# Patient Record
Sex: Female | Born: 1935 | Race: White | Hispanic: No | Smoking: Former smoker
Health system: Southern US, Community
[De-identification: ages and names within clinical notes are randomized; demographics above are authoritative.]

## PROBLEM LIST (undated history)

## (undated) DIAGNOSIS — E119 Type 2 diabetes mellitus without complications: Secondary | ICD-10-CM

## (undated) DIAGNOSIS — I10 Essential (primary) hypertension: Secondary | ICD-10-CM

## (undated) DIAGNOSIS — K219 Gastro-esophageal reflux disease without esophagitis: Secondary | ICD-10-CM

## (undated) DIAGNOSIS — N289 Disorder of kidney and ureter, unspecified: Secondary | ICD-10-CM

## (undated) DIAGNOSIS — M1990 Unspecified osteoarthritis, unspecified site: Secondary | ICD-10-CM

## (undated) HISTORY — PX: ABDOMINAL HYSTERECTOMY: SHX81

## (undated) HISTORY — PX: TOE SURGERY: SHX1073

## (undated) HISTORY — PX: MANDIBLE SURGERY: SHX707

## (undated) HISTORY — PX: OTHER SURGICAL HISTORY: SHX169

---

## 2012-08-10 ENCOUNTER — Emergency Department (HOSPITAL_BASED_OUTPATIENT_CLINIC_OR_DEPARTMENT_OTHER)
Admission: EM | Admit: 2012-08-10 | Discharge: 2012-08-10 | Disposition: A | Discharge status: Home / Self Care | Payer: Medicare Other | Attending: Emergency Medicine | Admitting: Emergency Medicine

## 2012-08-10 ENCOUNTER — Encounter (HOSPITAL_BASED_OUTPATIENT_CLINIC_OR_DEPARTMENT_OTHER): Payer: Self-pay | Admitting: Emergency Medicine

## 2012-08-10 ENCOUNTER — Emergency Department (HOSPITAL_BASED_OUTPATIENT_CLINIC_OR_DEPARTMENT_OTHER): Payer: Medicare Other

## 2012-08-10 DIAGNOSIS — Y929 Unspecified place or not applicable: Secondary | ICD-10-CM | POA: Insufficient documentation

## 2012-08-10 DIAGNOSIS — I10 Essential (primary) hypertension: Secondary | ICD-10-CM | POA: Insufficient documentation

## 2012-08-10 DIAGNOSIS — H1130 Conjunctival hemorrhage, unspecified eye: Secondary | ICD-10-CM | POA: Insufficient documentation

## 2012-08-10 DIAGNOSIS — Z79899 Other long term (current) drug therapy: Secondary | ICD-10-CM | POA: Insufficient documentation

## 2012-08-10 DIAGNOSIS — Z87891 Personal history of nicotine dependence: Secondary | ICD-10-CM | POA: Insufficient documentation

## 2012-08-10 DIAGNOSIS — S0083XA Contusion of other part of head, initial encounter: Secondary | ICD-10-CM

## 2012-08-10 DIAGNOSIS — K219 Gastro-esophageal reflux disease without esophagitis: Secondary | ICD-10-CM | POA: Insufficient documentation

## 2012-08-10 DIAGNOSIS — S023XXA Fracture of orbital floor, initial encounter for closed fracture: Secondary | ICD-10-CM

## 2012-08-10 DIAGNOSIS — S0230XA Fracture of orbital floor, unspecified side, initial encounter for closed fracture: Secondary | ICD-10-CM | POA: Insufficient documentation

## 2012-08-10 DIAGNOSIS — E119 Type 2 diabetes mellitus without complications: Secondary | ICD-10-CM | POA: Insufficient documentation

## 2012-08-10 DIAGNOSIS — Y9301 Activity, walking, marching and hiking: Secondary | ICD-10-CM | POA: Insufficient documentation

## 2012-08-10 DIAGNOSIS — Z880 Allergy status to penicillin: Secondary | ICD-10-CM | POA: Insufficient documentation

## 2012-08-10 DIAGNOSIS — H1132 Conjunctival hemorrhage, left eye: Secondary | ICD-10-CM

## 2012-08-10 DIAGNOSIS — Z87448 Personal history of other diseases of urinary system: Secondary | ICD-10-CM | POA: Insufficient documentation

## 2012-08-10 DIAGNOSIS — S0003XA Contusion of scalp, initial encounter: Secondary | ICD-10-CM | POA: Insufficient documentation

## 2012-08-10 DIAGNOSIS — W010XXA Fall on same level from slipping, tripping and stumbling without subsequent striking against object, initial encounter: Secondary | ICD-10-CM | POA: Insufficient documentation

## 2012-08-10 DIAGNOSIS — Z8739 Personal history of other diseases of the musculoskeletal system and connective tissue: Secondary | ICD-10-CM | POA: Insufficient documentation

## 2012-08-10 HISTORY — DX: Disorder of kidney and ureter, unspecified: N28.9

## 2012-08-10 HISTORY — DX: Unspecified osteoarthritis, unspecified site: M19.90

## 2012-08-10 HISTORY — DX: Type 2 diabetes mellitus without complications: E11.9

## 2012-08-10 HISTORY — DX: Essential (primary) hypertension: I10

## 2012-08-10 HISTORY — DX: Gastro-esophageal reflux disease without esophagitis: K21.9

## 2012-08-10 MED ORDER — KETOROLAC TROMETHAMINE 0.5 % OP SOLN: 1.0000 [drp] | Freq: Four times a day (QID) | OPHTHALMIC | Status: AC

## 2012-08-10 MED ORDER — CEPHALEXIN 500 MG PO CAPS: 500.0000 mg | ORAL_CAPSULE | Freq: Four times a day (QID) | ORAL | Status: AC

## 2012-08-10 NOTE — ED Notes (Signed)
MD at bedside. 

## 2012-08-10 NOTE — ED Provider Notes (Signed)
History    CSN: [id number] Arrival date & time 08/10/12  1147  First MD Initiated Contact with Patient 08/10/12 1222     Chief Complaint  Patient presents with  . Fall   (Consider location/radiation/quality/duration/timing/severity/associated sxs/prior Treatment) HPI Comments: Patient was walking this morning and tripped over a raised area in the floor.  She fell forward and struck her left forehead on the floor.  She denies loc or headache.  She has a laceration above the left forehead and swelling around the left eye.  There is also redness to the left eye itself.    Patient is a 79 y.o. female presenting with fall. The history is provided by the patient.  Fall This is a new problem. The current episode started 1 to 2 hours ago. The problem occurs constantly. The problem has not changed since onset.Pertinent negatives include no headaches. Nothing aggravates the symptoms. Nothing relieves the symptoms. She has tried nothing for the symptoms. The treatment provided no relief.   Past Medical History  Diagnosis Date  . Diabetes mellitus without complication   . Hypertension   . Renal disorder     between stage 2 and stage 3 renal deficency.   Marland Kitchen GERD (gastroesophageal reflux disease)   . Arthritis    Past Surgical History  Procedure Laterality Date  . Carotid artery repair    . Abdominal hysterectomy    . Mandible surgery    . Toe surgery     No family history on file. History  Substance Use Topics  . Smoking status: Former Games developer  . Smokeless tobacco: Not on file  . Alcohol Use: No   OB History   Grav Para Term Preterm Abortions TAB SAB Ect Mult Living                 Review of Systems  Neurological: Negative for headaches.  All other systems reviewed and are negative.    Allergies  Penicillins and Statins  Home Medications   Current Outpatient Rx  Name  Route  Sig  Dispense  Refill  . GABAPENTIN, PHN, PO   Oral   Take by mouth at bedtime.         Marland Kitchen  losartan (COZAAR) 100 MG tablet   Oral   Take by mouth daily.         Marland Kitchen OMEPRAZOLE PO   Oral   Take by mouth daily.          BP 175/58  Pulse 76  Temp(Src) 98.6 F (37 C) (Oral)  Resp 18  Ht 5\' 1"  (1.549 m)  Wt 122 lb (55.339 kg)  BMI 23.06 kg/m2  SpO2 98% Physical Exam  Nursing note and vitals reviewed. Constitutional: She is oriented to person, place, and time. She appears well-developed and well-nourished. No distress.  HENT:  Head: Normocephalic.  Right Ear: External ear normal.  Left Ear: External ear normal.  Mouth/Throat: Oropharynx is clear and moist.  TM's are clear and equal bilaterally.  Eyes: EOM are normal. Pupils are equal, round, and reactive to light.  There is a left subconjunctival hemorrhage noted.  There is no evidence of globe rupture.  The pupils are equally round and reactive.    Neck: Normal range of motion. Neck supple.  Cardiovascular: Normal rate and regular rhythm.   No murmur heard. Pulmonary/Chest: Effort normal and breath sounds normal. No respiratory distress. She has no wheezes.  Abdominal: Soft. Bowel sounds are normal.  Musculoskeletal: Normal range of motion. She  exhibits no edema.  Neurological: She is alert and oriented to person, place, and time. No cranial nerve deficit. She exhibits normal muscle tone. Coordination normal.  Skin: Skin is warm and dry. She is not diaphoretic.    ED Course  Procedures (including critical care time) Labs Reviewed - No data to display No results found. No diagnosis found.  LACERATION REPAIR Performed by: Geoffery Lyons Authorized by: Geoffery Lyons Consent: Verbal consent obtained. Risks and benefits: risks, benefits and alternatives were discussed Consent given by: patient Patient identity confirmed: provided demographic data Prepped and Draped in normal sterile fashion Wound explored  Laceration Location: left eyebrow  Laceration Length: 2.5cm  No Foreign Bodies seen or  palpated  Anesthesia: local infiltration  Local anesthetic: lidocaine 2% without epinephrine  Anesthetic total: 2 ml  Irrigation method: syringe Amount of cleaning: standard  Skin closure: 6-0 prolene  Number of sutures: 3  Technique: simple interrupted  Patient tolerance: Patient tolerated the procedure well with no immediate complications.   MDM  The patient presents after a fall.  The workup reveals an inferior orbital blow out fracture without evidence for entrapment on exam or ct scan.  She also has decreased visual acuity and a subconjunctival hemorrhage.  I have spoken with Dr. Lazarus Salines from ENT who recommends antibiotics, ice, and follow up with him in one week.  I have also spoken with Dr. Karleen Hampshire from [facility] who will follow up the eye issues tomorrow.    Geoffery Lyons, MD 08/10/12 [phone]

## 2012-08-10 NOTE — ED Notes (Signed)
Pt reports tripped & fell today in home, hitting raised area of floor. Small abrasion to right knee, approx 1.5cm laceration above left eyebrow, and bruising to left eye. No LOC Not on blood thinners.

## 2015-10-31 ENCOUNTER — Ambulatory Visit (INDEPENDENT_AMBULATORY_CARE_PROVIDER_SITE_OTHER): Payer: Medicare Other | Admitting: Podiatry

## 2015-10-31 DIAGNOSIS — L600 Ingrowing nail: Secondary | ICD-10-CM | POA: Diagnosis not present

## 2015-10-31 DIAGNOSIS — B351 Tinea unguium: Secondary | ICD-10-CM

## 2015-10-31 DIAGNOSIS — M79673 Pain in unspecified foot: Secondary | ICD-10-CM | POA: Diagnosis not present

## 2015-10-31 NOTE — Progress Notes (Signed)
SUBJECTIVE: 82 y.o. year old female presents complaining of pain in left great toe and 5th toe right.  Last blood sugar was 160 a couple of weeks ago.   Coronary, carotid stent, IBS, Arthritis on hands feet and neck,  REVIEW OF SYSTEMS: Positive for coronary artery disease with stent, IBS, arthritis in hands, feet, and neck.  OBJECTIVE: DERMATOLOGIC EXAMINATION: Thick dystrophic nails x 10. Ingrown nail left great toe. Painful digital corn 5th right.  VASCULAR EXAMINATION OF LOWER LIMBS: All pedal pulses are faintly palpable. Temperature gradient from tibial crest to dorsum of foot is within normal bilateral.  NEUROLOGIC EXAMINATION OF THE LOWER LIMBS: Achilles DTR is present and within normal. Monofilament (Semmes-Weinstein 10-gm) sensory testing positive 6 out of 6, bilateral. Vibratory sensations(128Hz  turning fork) intact at medial and lateral forefoot bilateral.  Sharp and Dull discriminatory sensations at the plantar ball of hallux is intact bilateral.   MUSCULOSKELETAL EXAMINATION: Conracted 5th toe right.  ASSESSMENT: Onychomycosis x 10. Painful ingrown nail right great toe. Contracted 5th digit with digital corn left foot painful.  PLAN: Reviewed clinical findings and available treatment options. All affected nails and corns debrided.

## 2015-10-31 NOTE — Patient Instructions (Signed)
Seen for painful nail on left great toe and corn on 5th toe on right.  All affected lesions debrided. May benefit from diabetic shoes. Return in 3 months or sooner if needed.

## 2015-11-06 ENCOUNTER — Encounter: Payer: Self-pay | Admitting: Podiatry

## 2019-02-18 ENCOUNTER — Ambulatory Visit: Payer: Self-pay

## 2020-11-09 ENCOUNTER — Emergency Department (HOSPITAL_COMMUNITY): Payer: Medicare Other

## 2020-11-09 ENCOUNTER — Emergency Department (HOSPITAL_COMMUNITY)
Admission: EM | Admit: 2020-11-09 | Discharge: 2020-11-09 | Disposition: A | Discharge status: Home / Self Care | Payer: Medicare Other | Attending: Emergency Medicine | Admitting: Emergency Medicine

## 2020-11-09 DIAGNOSIS — N183 Chronic kidney disease, stage 3 unspecified: Secondary | ICD-10-CM | POA: Diagnosis not present

## 2020-11-09 DIAGNOSIS — R531 Weakness: Secondary | ICD-10-CM | POA: Insufficient documentation

## 2020-11-09 DIAGNOSIS — Z79899 Other long term (current) drug therapy: Secondary | ICD-10-CM | POA: Insufficient documentation

## 2020-11-09 DIAGNOSIS — I129 Hypertensive chronic kidney disease with stage 1 through stage 4 chronic kidney disease, or unspecified chronic kidney disease: Secondary | ICD-10-CM | POA: Diagnosis not present

## 2020-11-09 DIAGNOSIS — X58XXXA Exposure to other specified factors, initial encounter: Secondary | ICD-10-CM | POA: Insufficient documentation

## 2020-11-09 DIAGNOSIS — S8012XA Contusion of left lower leg, initial encounter: Secondary | ICD-10-CM | POA: Insufficient documentation

## 2020-11-09 DIAGNOSIS — S40022A Contusion of left upper arm, initial encounter: Secondary | ICD-10-CM | POA: Diagnosis not present

## 2020-11-09 DIAGNOSIS — E1122 Type 2 diabetes mellitus with diabetic chronic kidney disease: Secondary | ICD-10-CM | POA: Insufficient documentation

## 2020-11-09 DIAGNOSIS — Z87891 Personal history of nicotine dependence: Secondary | ICD-10-CM | POA: Insufficient documentation

## 2020-11-09 DIAGNOSIS — S4992XA Unspecified injury of left shoulder and upper arm, initial encounter: Secondary | ICD-10-CM | POA: Diagnosis present

## 2020-11-09 LAB — URINALYSIS, ROUTINE W REFLEX MICROSCOPIC
Bilirubin Urine: NEGATIVE
Glucose, UA: 50 mg/dL — AB
Hgb urine dipstick: NEGATIVE
Ketones, ur: NEGATIVE mg/dL
Leukocytes,Ua: NEGATIVE
Nitrite: NEGATIVE
Protein, ur: NEGATIVE mg/dL
Specific Gravity, Urine: 1.017 (ref 1.005–1.030)
pH: 6 (ref 5.0–8.0)

## 2020-11-09 LAB — COMPREHENSIVE METABOLIC PANEL
ALT: 16 U/L (ref 0–44)
AST: 25 U/L (ref 15–41)
Albumin: 3.3 g/dL — ABNORMAL LOW (ref 3.5–5.0)
Alkaline Phosphatase: 75 U/L (ref 38–126)
Anion gap: 12 (ref 5–15)
BUN: 12 mg/dL (ref 8–23)
CO2: 22 mmol/L (ref 22–32)
Calcium: 8.8 mg/dL — ABNORMAL LOW (ref 8.9–10.3)
Chloride: 104 mmol/L (ref 98–111)
Creatinine, Ser: 0.74 mg/dL (ref 0.44–1.00)
GFR, Estimated: >60 mL/min (ref >60)
Glucose, Bld: 120 mg/dL — ABNORMAL HIGH (ref 70–99)
Potassium: 3.6 mmol/L (ref 3.5–5.1)
Sodium: 138 mmol/L (ref 135–145)
Total Bilirubin: 0.6 mg/dL (ref 0.3–1.2)
Total Protein: 6.1 g/dL — ABNORMAL LOW (ref 6.5–8.1)

## 2020-11-09 LAB — CBC WITH DIFFERENTIAL/PLATELET
Abs Immature Granulocytes: 0.02 10*3/uL (ref 0.00–0.07)
Basophils Absolute: 0 10*3/uL (ref 0.0–0.1)
Basophils Relative: 0 %
Eosinophils Absolute: 0 10*3/uL (ref 0.0–0.5)
Eosinophils Relative: 2 %
HCT: 39.6 % (ref 36.0–46.0)
Hemoglobin: 12.3 g/dL (ref 12.0–15.0)
Immature Granulocytes: 1 %
Lymphocytes Relative: 28 %
Lymphs Abs: 0.7 10*3/uL (ref 0.7–4.0)
MCH: 28.3 pg (ref 26.0–34.0)
MCHC: 31.1 g/dL (ref 30.0–36.0)
MCV: 91.2 fL (ref 80.0–100.0)
Monocytes Absolute: 0.5 10*3/uL (ref 0.1–1.0)
Monocytes Relative: 22 %
Neutro Abs: 1.2 10*3/uL — ABNORMAL LOW (ref 1.7–7.7)
Neutrophils Relative %: 47 %
Platelets: 165 10*3/uL (ref 150–400)
RBC: 4.34 MIL/uL (ref 3.87–5.11)
RDW: 14.3 % (ref 11.5–15.5)
WBC: 2.4 10*3/uL — ABNORMAL LOW (ref 4.0–10.5)
nRBC: 0 % (ref 0.0–0.2)

## 2020-11-09 NOTE — ED Triage Notes (Signed)
Pt BIB [facility] from home c/o stroke like symptoms. Per ems , pt usually ambulates around w a walker;but not able to do it today.  Pt had a previous stroke that affected her L side, but  Lside  feels weaker today. Also c/o Numbness of her L leg. LSN 830 am. A&O X4   VS EMS:   Bp 160/84 P88 Rr22 Cbg 186

## 2020-11-09 NOTE — Discharge Instructions (Addendum)
Your work-up was reassuring today.  You do appear somewhat malnourished overall.  It appears your strength has returned some now.  Follow-up with your doctors.

## 2020-11-09 NOTE — ED Provider Notes (Signed)
[facility] EMERGENCY DEPARTMENT Provider Note   CSN: [id number] Arrival date & time: 11/09/20  1619     History No chief complaint on file.   Cynthia Odom is a 87 y.o. female.  HPI Was presents with generalized weakness.  Reported brought in for strokelike symptoms.  Reportedly was weak this morning in both her legs and her arms also.  Has had a previous stroke with some chronic left-sided weakness.  Walks with a walker at baseline.  Was unable to walk today because both legs were feeling weak.  Was weaker on the left compared to the right but states she is always weaker.  Had been seen at PCP yesterday after a fall about a week ago.  Had x-rays done of the left arm and leg but unsure results.  Has had decreased oral intake.  Reportedly had difficulty lifting up her tea today.  No headache.  No dysuria.  No back pain.    Past Medical History:  Diagnosis Date   Arthritis    Diabetes mellitus without complication (HCC)    GERD (gastroesophageal reflux disease)    Hypertension    Renal disorder    between stage 2 and stage 3 renal deficency.     There are no problems to display for this patient.   Past Surgical History:  Procedure Laterality Date   ABDOMINAL HYSTERECTOMY     carotid artery repair     MANDIBLE SURGERY     TOE SURGERY       OB History   No obstetric history on file.     No family history on file.  Social History   Tobacco Use   Smoking status: Former  Substance Use Topics   Alcohol use: No   Drug use: No    Home Medications Prior to Admission medications   Medication Sig Start Date End Date Taking? Authorizing Provider  cephALEXin (KEFLEX) 500 MG capsule Take 1 capsule (500 mg total) by mouth 4 (four) times daily. 08/10/12   Geoffery Lyons, MD  GABAPENTIN, PHN, PO Take by mouth at bedtime.    [provider]  ketorolac (ACULAR) 0.5 % ophthalmic solution Place 1 drop into the left eye 4 (four) times daily. 08/10/12    Geoffery Lyons, MD  losartan (COZAAR) 100 MG tablet Take by mouth daily.    [provider]  OMEPRAZOLE PO Take by mouth daily.    [provider]    Allergies    Penicillins and Statins  Review of Systems   Review of Systems  Constitutional:  Positive for fatigue. Negative for appetite change and fever.  HENT:  Negative for congestion.   Respiratory:  Negative for shortness of breath.   Cardiovascular:  Negative for chest pain.  Gastrointestinal:  Negative for abdominal pain.  Genitourinary:  Negative for flank pain.  Musculoskeletal:  Negative for back pain and neck pain.  Neurological:  Positive for weakness.  Hematological:  Bruises/bleeds easily.  Psychiatric/Behavioral:  Negative for confusion.    Physical Exam Updated Vital Signs BP (!) 140/53   Pulse 77   Temp 97.8 F (36.6 C) (Oral)   Resp 18   SpO2 93%   Physical Exam Vitals and nursing note reviewed.  Constitutional:      Appearance: Normal appearance.  HENT:     Head: Normocephalic.  Eyes:     Extraocular Movements: Extraocular movements intact.     Pupils: Pupils are equal, round, and reactive to light.  Cardiovascular:  Rate and Rhythm: Normal rate and regular rhythm.  Chest:     Chest wall: No tenderness.  Abdominal:     Tenderness: There is no abdominal tenderness.  Musculoskeletal:     Cervical back: Neck supple.     Comments: Mild tenderness on left upper and lower extremities.  Some ecchymosis.  No deformity.  No cervical thoracic or lumbar spine tenderness.  Skin:    General: Skin is warm.     Capillary Refill: Capillary refill takes less than 2 seconds.  Neurological:     Mental Status: She is alert and oriented to person, place, and time.     Comments: Awake and appropriate.  Face symmetric.  Eye movements intact.  Some chronic left-sided weakness.  Weakness on left compared to right but that still some strength.  Sensation grossly intact in bilateral upper and lower  extremities.  Able to lift both legs off the bed on her own but with strength and    ED Results / Procedures / Treatments   Labs (all labs ordered are listed, but only abnormal results are displayed) Labs Reviewed  URINALYSIS, ROUTINE W REFLEX MICROSCOPIC - Abnormal; Notable for the following components:      Result Value   Glucose, UA 50 (*)    All other components within normal limits  COMPREHENSIVE METABOLIC PANEL - Abnormal; Notable for the following components:   Glucose, Bld 120 (*)    Calcium 8.8 (*)    Total Protein 6.1 (*)    Albumin 3.3 (*)    All other components within normal limits  CBC WITH DIFFERENTIAL/PLATELET - Abnormal; Notable for the following components:   WBC 2.4 (*)    Neutro Abs 1.2 (*)    All other components within normal limits    EKG EKG Interpretation  Date/Time:  Thursday November 09 2020 16:32:40 EDT Ventricular Rate:  81 PR Interval:  176 QRS Duration: 74 QT Interval:  371 QTC Calculation: 431 R Axis:   -17 Text Interpretation: Sinus rhythm Atrial premature complex Borderline left axis deviation Borderline low voltage, extremity leads Confirmed by Benjiman Core [phone]) on 11/09/2020 4:55:35 PM  Radiology CT HEAD WO CONTRAST ( )  Result Date: 11/09/2020 CLINICAL DATA:  Acute neurological deficit.  Stroke suspected. EXAM: CT HEAD WITHOUT CONTRAST TECHNIQUE: Contiguous axial images were obtained from the base of the skull through the vertex without intravenous contrast. COMPARISON:  08/15/2020 FINDINGS: Brain: Diffuse cerebral atrophy. Ventricular dilatation consistent with central atrophy. Low-attenuation changes in the deep white matter consistent with small vessel ischemia. No abnormal extra-axial fluid collections. No mass effect or midline shift. Gray-white matter junctions are distinct. Basal cisterns are not effaced. No acute intracranial hemorrhage. Vascular: Moderate intracranial arterial vascular calcifications. Skull: Calvarium  appears intact. Sinuses/Orbits: Paranasal sinuses and mastoid air cells are clear. Other: No significant changes since the previous study. IMPRESSION: No acute intracranial abnormalities. Chronic atrophy and small vessel ischemic changes. Electronically Signed   By: Burman Nieves M.D.   On: 11/09/2020 18:42   DG Chest Portable 1 View  Result Date: 11/09/2020 CLINICAL DATA:  Weakness EXAM: PORTABLE CHEST 1 VIEW COMPARISON:  08/15/2020 FINDINGS: The heart size and mediastinal contours are within normal limits. Both lungs are clear. The visualized skeletal structures are unremarkable. IMPRESSION: No active disease. Electronically Signed   By: Deatra Robinson M.D.   On: 11/09/2020 19:59    Procedures Procedures   Medications Ordered in ED Medications - No data to display  ED Course  I have reviewed  the triage vital signs and the nursing notes.  Pertinent labs & imaging results that were available during my care of the patient were reviewed by me and considered in my medical decision making (see chart for details).    MDM Rules/Calculators/A&P                           Patient with generalized weakness.  Questionable paresthesias on the left side that had resolved.  Was feeling weak all over not just lateralizing to the left side.  Head CT reassuring.  Urine reassuring.  States she feels better and feels that her strength is back.  Blood work showed potentially some chronic malnutrition with decreased albumin and decreased total protein.  Do not think we need admission for another stroke work-up with symptoms all improved.  I feel as if this is more of a general weakness as opposed to lateralizing weakness.  Follow-up with PCP as an outpatient Final Clinical Impression(s) / ED Diagnoses Final diagnoses:  Weakness    Rx / DC Orders ED Discharge Orders     None        Benjiman Core, MD 11/09/20 2112

## 2022-04-29 DEATH — deceased

## 2022-06-09 IMAGING — CT CT HEAD W/O CM
4 series · 16 of 47 positions shown, 18 images · non-contrast
Comparison: 08/15/2020

CLINICAL DATA: Acute neurological deficit.  Stroke suspected.

EXAM:
CT HEAD WITHOUT CONTRAST
TECHNIQUE: Contiguous axial images were obtained from the base of the skull
through the vertex without intravenous contrast.

[Series 3: head without · axial · non-contrast · 0.41mm/px · z∈[-44,+66]mm · 7 of 30 slices shown, 9 images]
[im 4/30  brain]
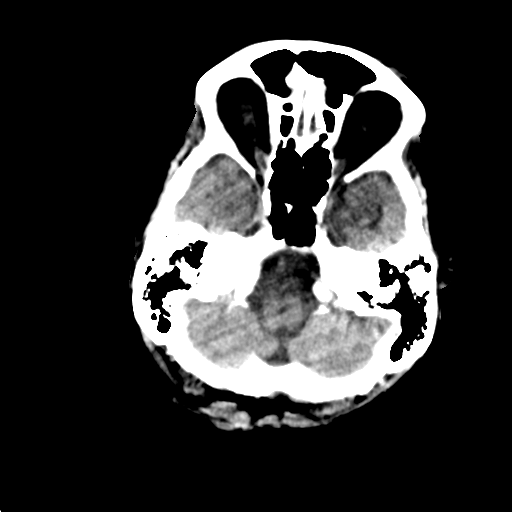
[im 4/30  bone]
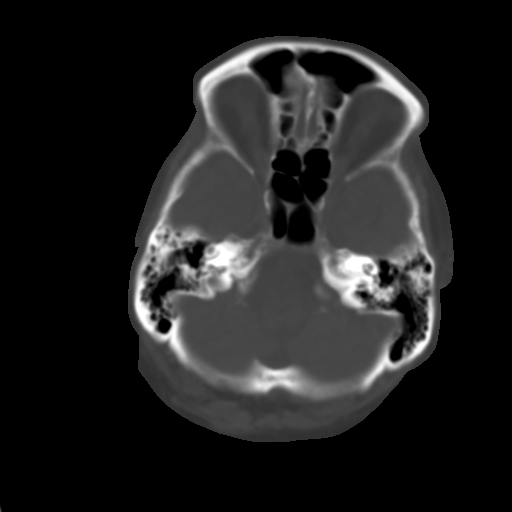
[im 8/30  brain]
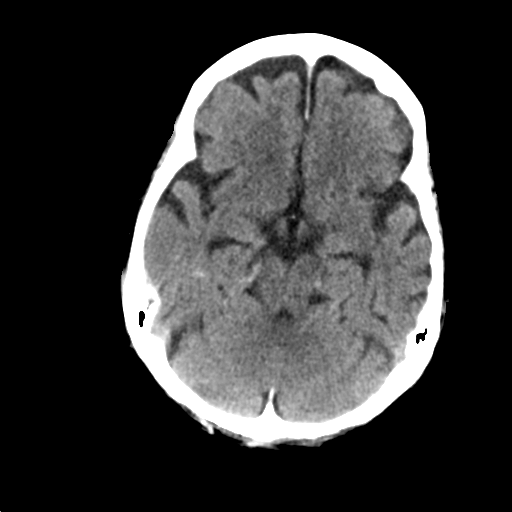
[im 11/30  brain]
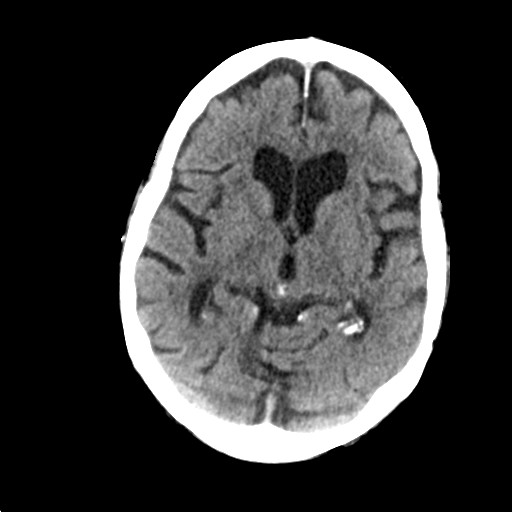
[im 15/30  brain]
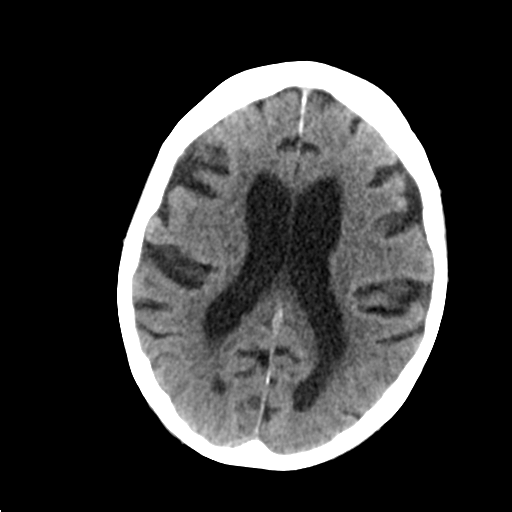
[im 19/30  brain]
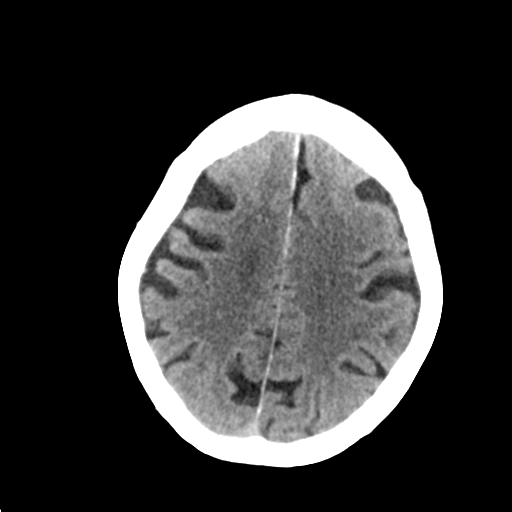
[im 19/30  bone]
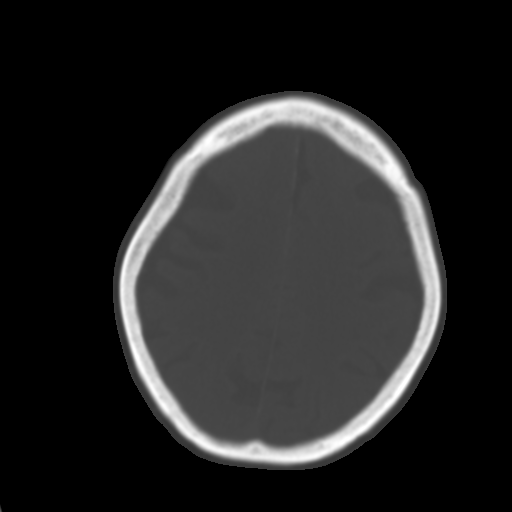
[im 22/30  brain]
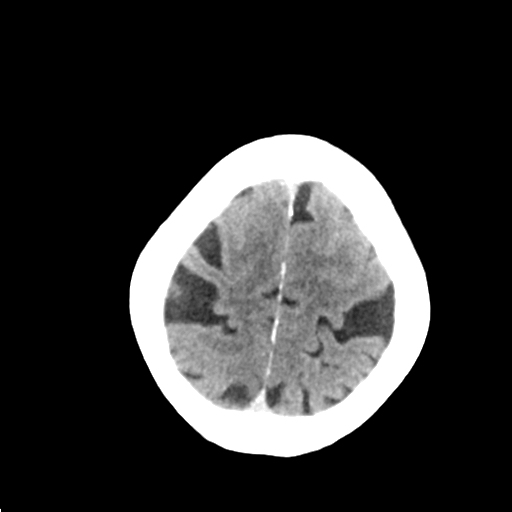
[im 26/30  brain]
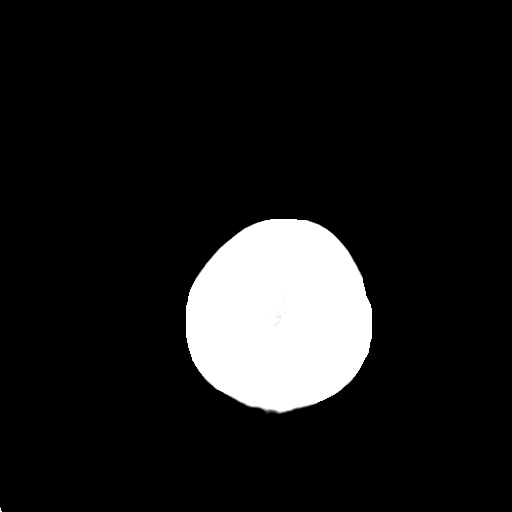

[Series 4: head bone · axial · 0.41mm/px · z∈[-45,-15]mm · 3 of 75 slices shown]
[im 8/75  bone]
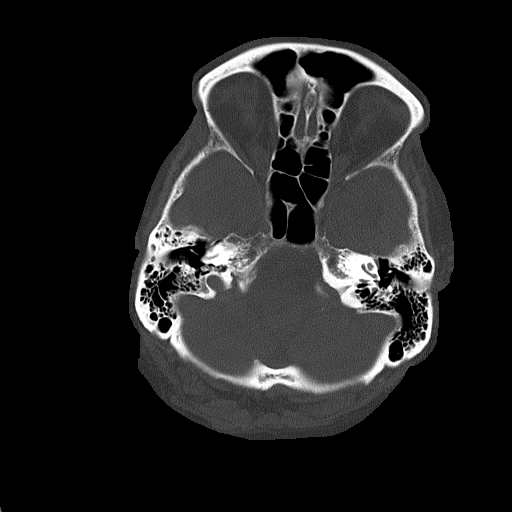
[im 15/75  bone]
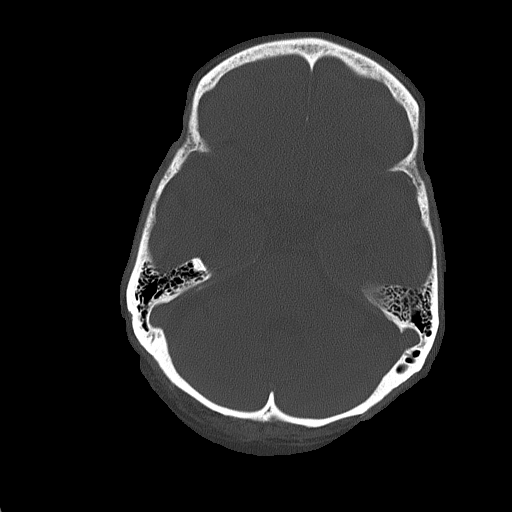
[im 23/75  bone]
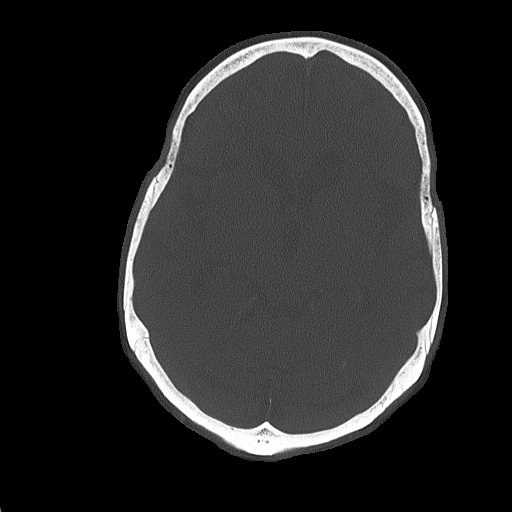

[Series 5: head without cor · coronal · non-contrast · 0.30mm/px · 3 of 67 slices shown]
[im 23/67  brain]
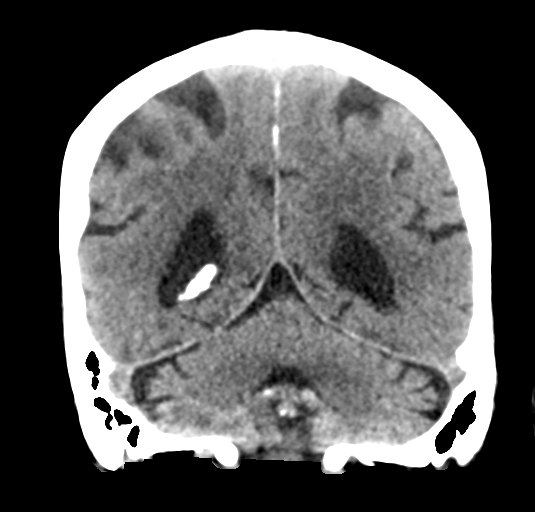
[im 30/67  brain]
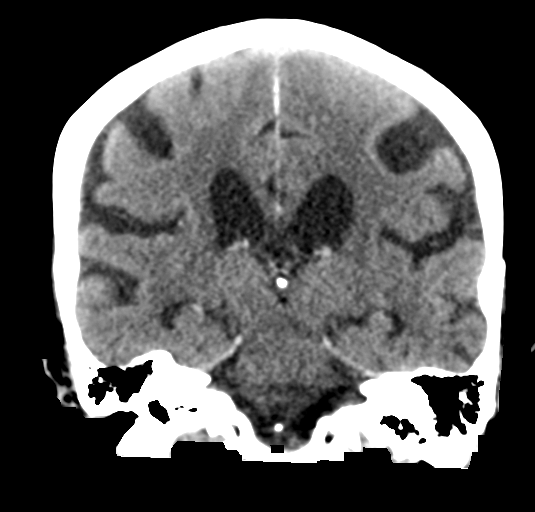
[im 37/67  brain]
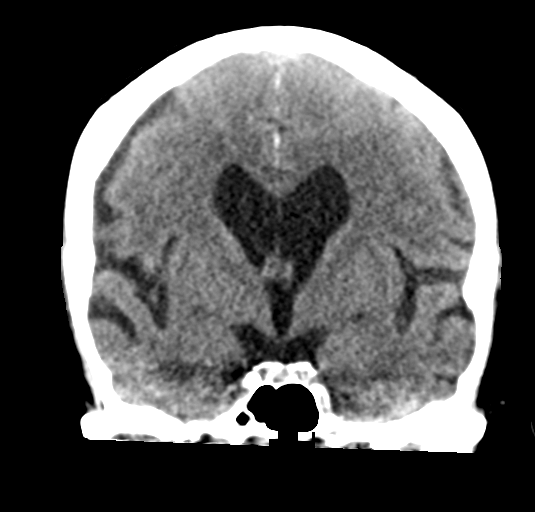

[Series 7: head without sag · sagittal · non-contrast · 0.29mm/px · 3 of 53 slices shown]
[im 18/53  brain]
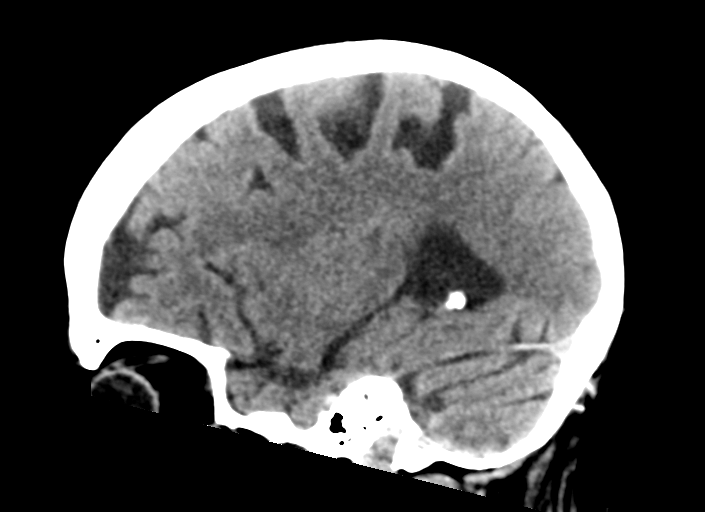
[im 27/53  brain]
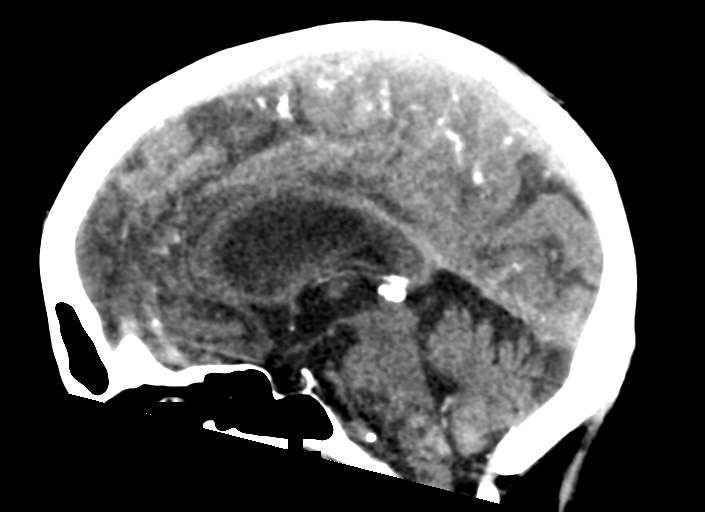
[im 35/53  brain]
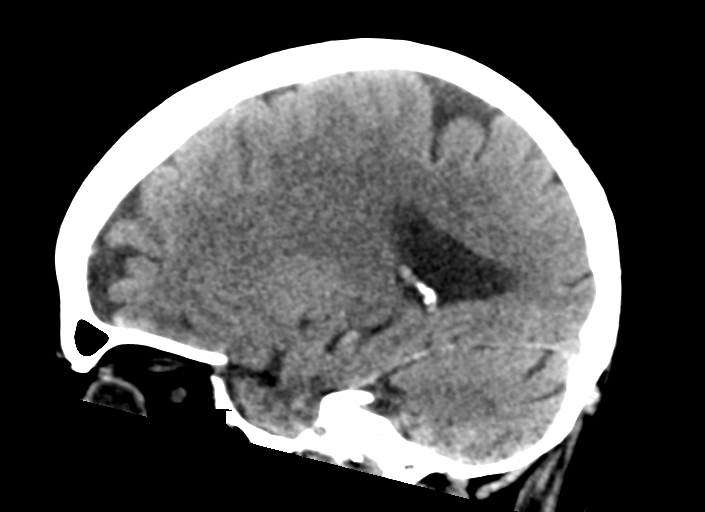

[16 of 47 positions shown; findings below may reference images not displayed]

FINDINGS: Brain: Diffuse cerebral atrophy. Ventricular dilatation consistent
with central atrophy. Low-attenuation changes in the deep white
matter consistent with small vessel ischemia. No abnormal
extra-axial fluid collections. No mass effect or midline shift.
Gray-white matter junctions are distinct. Basal cisterns are not
effaced. No acute intracranial hemorrhage.

Vascular: Moderate intracranial arterial vascular calcifications.

Skull: Calvarium appears intact.

Sinuses/Orbits: Paranasal sinuses and mastoid air cells are clear.

Other: No significant changes since the previous study.
IMPRESSION: No acute intracranial abnormalities. Chronic atrophy and small
vessel ischemic changes.
